# Patient Record
Sex: Male | Born: 1965 | Race: Black or African American | Hispanic: No | Marital: Single | Smoking: Never smoker
Health system: Southern US, Community
[De-identification: ages and names within clinical notes are randomized; demographics above are authoritative.]

## PROBLEM LIST (undated history)

## (undated) DIAGNOSIS — I10 Essential (primary) hypertension: Secondary | ICD-10-CM

## (undated) DIAGNOSIS — E119 Type 2 diabetes mellitus without complications: Secondary | ICD-10-CM

---

## 2012-06-10 ENCOUNTER — Emergency Department (HOSPITAL_COMMUNITY)
Admission: EM | Admit: 2012-06-10 | Discharge: 2012-06-10 | Disposition: A | Discharge status: Home / Self Care | Payer: No Typology Code available for payment source | Source: Home / Self Care

## 2012-06-10 ENCOUNTER — Encounter (HOSPITAL_COMMUNITY): Payer: Self-pay | Admitting: Emergency Medicine

## 2012-06-10 DIAGNOSIS — H9190 Unspecified hearing loss, unspecified ear: Secondary | ICD-10-CM

## 2012-06-10 DIAGNOSIS — H9319 Tinnitus, unspecified ear: Secondary | ICD-10-CM

## 2012-06-10 DIAGNOSIS — H9311 Tinnitus, right ear: Secondary | ICD-10-CM

## 2012-06-10 DIAGNOSIS — H9191 Unspecified hearing loss, right ear: Secondary | ICD-10-CM

## 2012-06-10 DIAGNOSIS — R51 Headache: Secondary | ICD-10-CM

## 2012-06-10 NOTE — ED Notes (Signed)
Pt reports symptoms started off a year ago went to have ears cleaned because of feeling fullness and trouble hearing. Ears were cleaned but started to have pain and did not go back to that provider.   States that in November when he was putting away bunk on truck it came down hitting the top of head.   Has been having constant headaches since and ringing, fullness, and trouble hearing.   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

## 2012-06-10 NOTE — ED Provider Notes (Signed)
Gabriel Willis is a 47 y.o. male who presents to Urgent Care today for several issues. 1) primary issue is urinary loss and tinnitus in the right ear.  This is been present for about one year. He has had a history of cerumen impaction. As his symptoms have worsened recently. He has not had any cerumen removal recently. He cannot think of any cause for this. He does not coexisting headache noted below.   2) patient notes headache starting about 6 months ago. He had a large object fall on the top of his head. Since then he notes a morning headache. He denies any blurry vision dizziness fogginess loss of coordination. Henies any history of sleep apnea. He feels well otherwise no fevers or chills.   PMH reviewed. Hypertension  History  Substance Use Topics  . Smoking status: Never Smoker   . Smokeless tobacco: Not on file  . Alcohol Use: Yes   ROS as above Medications reviewed. No current facility-administered medications for this encounter.   No current outpatient prescriptions on file.    Exam:  BP 155/105  Pulse 91  Temp(Src) 98.5 F (36.9 C) (Oral)  SpO2 97% Gen: Well NAD HEENT: EOMI,  MMM, right ear completely occluded by cerumen. Left tympanic membrane appears to be retracted . PERRL Lungs: CTABL Nl WOB Heart: RRR no MRG Abd: NABS, NT, ND Exts: Non edematous BL  LE, warm and well perfused.  Neuro : Alert and oriented cranial nerves II through XII are intact sensation and strength are intact balance is normal coordination is intact bilaterally gait is normal    The right ear was irrigated the tympanic membrane is normal-appearing after urination. His symptoms improved following irrigation No results found for this or any previous visit (from the past 24 hour(s)). No results found.  Assessment and Plan: 47 y.o. male with    1) hearing loss and tinnitus. Possibly cerumen impaction however could be other. Refer to ENT for hearing evaluation  2) morning headache: I am  concerned for sleep apnea. Recommend followup with primary care Dr. for consideration sleep study.     Rodolph Bong, MD 06/10/12 1630

## 2012-06-10 NOTE — ED Provider Notes (Signed)
Medical screening examination/treatment/procedure(s) were performed by non-physician practitioner and as supervising physician I was immediately available for consultation/collaboration.  Romona Murdy   Sharai Overbay, MD 06/10/12 1840 

## 2012-06-19 NOTE — ED Notes (Signed)
Patient called, asking for referral to Dr office for evaluation of hearing loss and ringing in ears. Appointment set with Dr Pollyann Kennedy, for April 3 rd at 10 am ; advised to arrive early for paperwork. Detailed information provided for patient regarding his appointmant

## 2019-05-28 ENCOUNTER — Other Ambulatory Visit: Payer: Self-pay

## 2019-05-28 ENCOUNTER — Encounter (HOSPITAL_COMMUNITY): Payer: Self-pay | Admitting: Emergency Medicine

## 2019-05-28 ENCOUNTER — Emergency Department (HOSPITAL_COMMUNITY)
Admission: EM | Admit: 2019-05-28 | Discharge: 2019-05-28 | Disposition: A | Discharge status: Home / Self Care | Payer: BC Managed Care – PPO | Attending: Emergency Medicine | Admitting: Emergency Medicine

## 2019-05-28 ENCOUNTER — Emergency Department (HOSPITAL_COMMUNITY): Payer: BC Managed Care – PPO

## 2019-05-28 DIAGNOSIS — R079 Chest pain, unspecified: Secondary | ICD-10-CM

## 2019-05-28 DIAGNOSIS — E119 Type 2 diabetes mellitus without complications: Secondary | ICD-10-CM | POA: Diagnosis not present

## 2019-05-28 DIAGNOSIS — R0789 Other chest pain: Secondary | ICD-10-CM | POA: Diagnosis not present

## 2019-05-28 DIAGNOSIS — I10 Essential (primary) hypertension: Secondary | ICD-10-CM | POA: Diagnosis not present

## 2019-05-28 HISTORY — DX: Type 2 diabetes mellitus without complications: E11.9

## 2019-05-28 HISTORY — DX: Essential (primary) hypertension: I10

## 2019-05-28 LAB — CBC
HCT: 42.7 % (ref 39.0–52.0)
Hemoglobin: 14.2 g/dL (ref 13.0–17.0)
MCH: 32.1 pg (ref 26.0–34.0)
MCHC: 33.3 g/dL (ref 30.0–36.0)
MCV: 96.6 fL (ref 80.0–100.0)
Platelets: 305 10*3/uL (ref 150–400)
RBC: 4.42 MIL/uL (ref 4.22–5.81)
RDW: 13.1 % (ref 11.5–15.5)
WBC: 11.6 10*3/uL — ABNORMAL HIGH (ref 4.0–10.5)
nRBC: 0 % (ref 0.0–0.2)

## 2019-05-28 LAB — D-DIMER, QUANTITATIVE: D-Dimer, Quant: <0.27 ug/mL-FEU (ref 0.00–0.50)

## 2019-05-28 LAB — BASIC METABOLIC PANEL
Anion gap: 9 (ref 5–15)
BUN: 12 mg/dL (ref 6–20)
CO2: 28 mmol/L (ref 22–32)
Calcium: 9.3 mg/dL (ref 8.9–10.3)
Chloride: 105 mmol/L (ref 98–111)
Creatinine, Ser: 1.21 mg/dL (ref 0.61–1.24)
GFR calc Af Amer: >60 mL/min (ref >60)
GFR calc non Af Amer: >60 mL/min (ref >60)
Glucose, Bld: 121 mg/dL — ABNORMAL HIGH (ref 70–99)
Potassium: 3.7 mmol/L (ref 3.5–5.1)
Sodium: 142 mmol/L (ref 135–145)

## 2019-05-28 LAB — TROPONIN I (HIGH SENSITIVITY)
Troponin I (High Sensitivity): 7 ng/L (ref <18)
Troponin I (High Sensitivity): 7 ng/L (ref <18)

## 2019-05-28 MED ORDER — LOSARTAN POTASSIUM 50 MG PO TABS
100.0000 mg | ORAL_TABLET | Freq: Once | ORAL | Status: AC
  Administered 2019-05-28: 100 mg via ORAL
  Filled 2019-05-28: qty 2

## 2019-05-28 MED ORDER — HYDROCHLOROTHIAZIDE 25 MG PO TABS
25.0000 mg | ORAL_TABLET | Freq: Every day | ORAL | Status: DC
  Administered 2019-05-28: 21:00:00 25 mg via ORAL
  Filled 2019-05-28: qty 1

## 2019-05-28 MED ORDER — LOSARTAN POTASSIUM-HCTZ 100-25 MG PO TABS: 1.0000 | ORAL_TABLET | Freq: Every day | ORAL | 0 refills | Status: AC

## 2019-05-28 NOTE — ED Notes (Addendum)
Patient verbalizes understanding of discharge instructions. Opportunity for questioning and answers were provided. Armband removed by staff, pt discharged from ED ambulatory. Esignature pad not available in hallway

## 2019-05-28 NOTE — ED Provider Notes (Signed)
[facility] EMERGENCY DEPARTMENT Provider Note   CSN: [id number] Arrival date & time: 05/28/19  1534     History Chief Complaint  Patient presents with  . Chest Pain    Gabriel Willis is a 54 y.o. male.  Gabriel Willis is a 54 y.o. male with a history of hypertension and diabetes, notes to the ED for evaluation of intermittent chest pain episodes.  Patient states that over the past month he has intermittently noted pain in his chest.  It often happens when he is up walking around but occasionally occurs at rest.  He reports it lasts a few minutes and then goes away.  He describes it as a sort of pressure and tightness in the left side of his chest.  Denies any current chest pain. He reports that this week he has had some occasional shortness of breath with these episodes and a cough, was seen in urgent care and had a Covid test done.  He has not had any fevers and states he is very careful and does not know of any Covid exposures.  He does state that he ran out of his blood pressure medicine about 2 months ago and he wonders if that is contributing to these chest pain episodes.  He does work as a Administrator, has not had any lower extremity swelling or pain.  Reports a family history of heart disease, he has not ever seen a cardiologist or had stress testing.  No history of PE or DVT.  No smoking history.  No other aggravating or alleviating factors.        Past Medical History:  Diagnosis Date  . Diabetes mellitus without complication ([facility])   . Hypertension     There are no problems to display for this patient.   History reviewed. No pertinent surgical history.     No family history on file.  Social History   Tobacco Use  . Smoking status: Never Smoker  Substance Use Topics  . Alcohol use: Yes  . Drug use: No    Home Medications Prior to Admission medications   Not on File    Allergies    Patient has no known allergies.  Review of Systems     Review of Systems  Constitutional: Negative for chills and fever.  HENT: Negative.   Respiratory: Positive for cough and shortness of breath.   Cardiovascular: Positive for chest pain.  Gastrointestinal: Negative for abdominal pain, nausea and vomiting.  Genitourinary: Negative for dysuria and frequency.  Musculoskeletal: Negative for arthralgias and myalgias.  Skin: Negative for color change and rash.  Neurological: Negative for dizziness, syncope and light-headedness.    Physical Exam Updated Vital Signs BP (!) 169/114   Pulse 63   Temp 98.7 F (37.1 C) (Oral)   Resp 18   Ht 6\' 2"  (1.88 m)   Wt 127.9 kg   SpO2 98%   BMI 36.21 kg/m   Physical Exam Vitals and nursing note reviewed.  Constitutional:      General: He is not in acute distress.    Appearance: He is well-developed. He is not diaphoretic.  HENT:     Head: Normocephalic and atraumatic.  Eyes:     General:        Right eye: No discharge.        Left eye: No discharge.     Pupils: Pupils are equal, round, and reactive to light.  Cardiovascular:     Rate and Rhythm: Normal rate  and regular rhythm.     Pulses:          Radial pulses are 2+ on the right side and 2+ on the left side.       Dorsalis pedis pulses are 2+ on the right side and 2+ on the left side.     Heart sounds: Normal heart sounds. No murmur. No friction rub. No gallop.   Pulmonary:     Effort: Pulmonary effort is normal. No respiratory distress.     Breath sounds: Normal breath sounds. No wheezing or rales.     Comments: Respirations equal and unlabored, patient able to speak in full sentences, lungs clear to auscultation bilaterally Chest:     Chest wall: No tenderness.  Abdominal:     General: Bowel sounds are normal. There is no distension.     Palpations: Abdomen is soft. There is no mass.     Tenderness: There is no abdominal tenderness. There is no guarding.     Comments: Abdomen soft, nondistended, nontender to palpation in all  quadrants without guarding or peritoneal signs  Musculoskeletal:        General: No deformity.     Cervical back: Neck supple.     Right lower leg: No edema.     Left lower leg: No tenderness. No edema.     Comments: Bilateral lower extremities are warm and well-perfused without edema or tenderness  Skin:    General: Skin is warm and dry.     Capillary Refill: Capillary refill takes less than 2 seconds.  Neurological:     Mental Status: He is alert.     Coordination: Coordination normal.     Comments: Speech is clear, able to follow commands Moves extremities without ataxia, coordination intact  Psychiatric:        Mood and Affect: Mood normal.        Behavior: Behavior normal.     ED Results / Procedures / Treatments   Labs (all labs ordered are listed, but only abnormal results are displayed) Labs Reviewed  BASIC METABOLIC PANEL - Abnormal; Notable for the following components:      Result Value   Glucose, Bld 121 (*)    All other components within normal limits  CBC - Abnormal; Notable for the following components:   WBC 11.6 (*)    All other components within normal limits  TROPONIN I (HIGH SENSITIVITY)  TROPONIN I (HIGH SENSITIVITY)    EKG EKG Interpretation  Date/Time:  Tuesday May 28 2019 15:39:00 EST Ventricular Rate:  89 PR Interval:  152 QRS Duration: 90 QT Interval:  362 QTC Calculation: 440 R Axis:   71 Text Interpretation: Normal sinus rhythm Normal ECG no prior available for comparison Confirmed by Tilden Fossa [phone]) on 05/28/2019 6:41:28 PM   Radiology DG Chest 2 View  Result Date: 05/28/2019 CLINICAL DATA:  Chest pain and dry cough EXAM: CHEST - 2 VIEW COMPARISON:  None. FINDINGS: The heart size and mediastinal contours are within normal limits. Both lungs are clear. The visualized skeletal structures are unremarkable. IMPRESSION: No acute abnormality of the lungs. Electronically Signed   By: Lauralyn Primes M.D.   On: 05/28/2019 16:24     Procedures Procedures (including critical care time)  Medications Ordered in ED Medications  losartan (COZAAR) tablet 100 mg (100 mg Oral Given 05/28/19 2056)    ED Course  I have reviewed the triage vital signs and the nursing notes.  Pertinent labs & imaging results that were  available during my care of the patient were reviewed by me and considered in my medical decision making (see chart for details).    MDM Rules/Calculators/A&P                     Patient presents to the emergency department with chest pain. Patient nontoxic appearing, in no apparent distress, vitals without significant abnormality. Fairly benign physical exam.   Prior heart catheterization reviewed: none Patient's primary cardiologist: none  DDX: ACS, pulmonary embolism, dissection, pneumothorax, pneumonia, arrhythmia, severe anemia, MSK, GERD, anxiety. Evaluation initiated with labs, EKG, and CXR. Patient on cardiac monitor.   CBC: Mild leukocytosis, normal hemoglobin BMP: Glucose of 121, no other electrolyte derangements, normal renal function Troponin: Negative x2 D-dimer: Negative EKG: Normal sinus rhythm with no ischemic changes, no prior available for comparison CXR:  Negative, without infiltrate, effusion, pneumothorax, or fracture/dislocation.   [place] Pathway Score 3- EKG without obvious acute ischemia, delta troponin negative, doubt ACS. D-dimer negative, doubt pulmonary embolism. Pain is not a tearing sensation, symmetric pulses, no widening of mediastinum on CXR, doubt dissection. Cardiac monitor reviewed, no notable arrhythmias or tachycardia. Patient has appeared hemodynamically stable throughout ER visit and appears safe for discharge with close PCP/cardiology follow up. I discussed results, treatment plan, need for PCP follow-up, and return precautions with the patient. Provided opportunity for questions, patient confirmed understanding and is in agreement with plan.   Final Clinical  Impression(s) / ED Diagnoses Final diagnoses:  Central chest pain  Hypertension, unspecified type    Rx / DC Orders ED Discharge Orders    None       Legrand Rams 05/31/19 1334    Tilden Fossa, MD 05/31/19 1447

## 2019-05-28 NOTE — Discharge Instructions (Signed)
Your work-up today was reassuring, but it is important that you follow-up with cardiology regarding the episodes of chest pain you have been having.  I placed a referral but if you do not hear from them in the next few days please call to schedule an appointment.  I have refilled your blood pressure medication please begin taking this again daily.  Return to the ED for any new or concerning symptoms.

## 2019-05-28 NOTE — ED Triage Notes (Signed)
Pt endorses intermittent chest pressure for a few days. Not on BP meds for a month due to changing PCPs.

## 2019-05-30 ENCOUNTER — Ambulatory Visit: Payer: BC Managed Care – PPO | Admitting: Cardiology

## 2019-05-30 ENCOUNTER — Encounter: Payer: Self-pay | Admitting: Cardiology

## 2019-05-30 ENCOUNTER — Other Ambulatory Visit: Payer: Self-pay

## 2019-05-30 ENCOUNTER — Encounter: Payer: Self-pay | Admitting: *Deleted

## 2019-05-30 ENCOUNTER — Telehealth: Payer: Self-pay | Admitting: Cardiology

## 2019-05-30 VITALS — BP 142/98 | HR 96 | Ht 74.0 in | Wt 292.8 lb

## 2019-05-30 DIAGNOSIS — Z8249 Family history of ischemic heart disease and other diseases of the circulatory system: Secondary | ICD-10-CM

## 2019-05-30 DIAGNOSIS — R072 Precordial pain: Secondary | ICD-10-CM | POA: Diagnosis not present

## 2019-05-30 DIAGNOSIS — I10 Essential (primary) hypertension: Secondary | ICD-10-CM

## 2019-05-30 DIAGNOSIS — E119 Type 2 diabetes mellitus without complications: Secondary | ICD-10-CM

## 2019-05-30 DIAGNOSIS — Z7189 Other specified counseling: Secondary | ICD-10-CM

## 2019-05-30 DIAGNOSIS — E785 Hyperlipidemia, unspecified: Secondary | ICD-10-CM

## 2019-05-30 NOTE — Progress Notes (Signed)
Cardiology Office Note:    Date:  05/30/2019   ID:  Gabriel Willis, DOB 25-Sep-1965, MRN [id number]  PCP:  Patient, No Pcp Per pending switch to new PCP Cardiologist:  Gabriel Red, MD  Referring MD: Gabriel Willis   CC: new patient consultation for chest pain  History of Present Illness:    Gabriel Willis is a 54 y.o. male with a hx of hypertension, type II diabetes who is seen as a new consult at the request of Gabriel Lodge, PA-C for the evaluation and management of chest pain.  ER note from 05/28/19 reviewed. Incomplete note at this time. hsTn 7, 7. Ddimer normal. Referred to cardiology for further evaluation.  Chest pain: -Initial onset, description, frequency, duration: about 5 days ago, lasts 1-2 minutes (10 minutes at the longest), not related to exertion (happening lying down, driving, etc). Happening about 3 times/day. Feels like a chest pressure in the center of his chest. No radiation. Noted when driving in the [place], was winter weather. Has had issues with breathing/tightness in his chest with cold weather. 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 Ran out of losartan in January waiting for new PCP visit, just refilled yesterday. Has not taken yet today. 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 Older brother had 4V CABG. Sister with a heart murmur, another sister with a pacemaker. Mother had high blood pressure, diabetes. Died age 53 of  kidney failure. Father died of lung cancer at age 51. Total of 10 siblings, 5 boys/5 girls.   Notes history of high cholesterol, though I do not have numbers. Has gained weight with the pandemic. Working on diet, eating more steamed vegetables. Minimal activity as a truck driver.  Past Medical History:  Diagnosis Date  . Diabetes mellitus without complication (HCC)   . Hypertension     History reviewed. No pertinent surgical history.  Current Medications: Current Outpatient Medications on File Prior to Visit  Medication Sig  . losartan-hydrochlorothiazide (HYZAAR) 100-25 MG tablet Take 1 tablet by mouth daily.  . metFORMIN (GLUCOPHAGE) 500 MG tablet Take 500 mg by mouth daily with breakfast.   No current facility-administered medications on file prior to visit.     Allergies:   Patient has no known allergies.   Social History   Tobacco Use  . Smoking status: Never Smoker  . Smokeless tobacco: Never Used  Substance Use Topics  . Alcohol use: Yes  . Drug use: No    Family History: younger brother has high blood pressure and diabetes, had a heart cath, has sickle cell trait. Older brother had 4V CABG. Sister with a heart murmur, another sister with a pacemaker. Mother had high blood pressure, diabetes. Died age 64 of kidney failure. Father died of lung cancer at age 54. Total of 10 siblings, 5 boys/5 girls.   ROS:   Please see the history of present illness.  Additional pertinent ROS: Constitutional: Negative for chills, fever, night sweats, unintentional weight loss  HENT: Negative for ear pain and hearing loss.   Eyes: Negative for  loss of vision and eye pain.  Respiratory: Negative for cough, sputum, wheezing.   Cardiovascular: See HPI. Gastrointestinal: Negative for abdominal pain, melena, and hematochezia.  Genitourinary: Negative for dysuria and hematuria.  Musculoskeletal: Negative for falls and myalgias.  Skin: Negative for itching and rash.  Neurological: Negative  for focal weakness, focal sensory changes and loss of consciousness.  Endo/Heme/Allergies: Does not bruise/bleed easily.     EKGs/Labs/Other Studies Reviewed:    The following studies were reviewed today: No prior cardiac studies  EKG:  EKG is personally reviewed.  The ekg ordered today demonstrates NSR, nonspecific changes  Recent Labs: 05/28/2019: BUN 12; Creatinine, Ser 1.21; Hemoglobin 14.2; Platelets 305; Potassium 3.7; Sodium 142  Recent Lipid Panel No results found for: CHOL, TRIG, HDL, CHOLHDL, VLDL, LDLCALC, LDLDIRECT  Physical Exam:    VS:  BP (!) 142/98   Pulse 96   Ht 6\' 2"  (1.88 m)   Wt 292 lb 12.8 oz (132.8 kg)   SpO2 96%   BMI 37.59 kg/m     Wt Readings from Last 3 Encounters:  05/30/19 292 lb 12.8 oz (132.8 kg)  05/28/19 282 lb (127.9 kg)    GEN: Well nourished, well developed in no acute distress HEENT: Normal, moist mucous membranes NECK: No JVD CARDIAC: regular rhythm, normal S1 and S2, no rubs or gallops. No murmurs. VASCULAR: Radial and DP pulses 2+ bilaterally. No carotid bruits RESPIRATORY:  Clear to auscultation without rales, wheezing or rhonchi  ABDOMEN: Soft, non-tender, non-distended MUSCULOSKELETAL:  Ambulates independently SKIN: Warm and dry, no edema NEUROLOGIC:  Alert and oriented x 3. No focal neuro deficits noted. PSYCHIATRIC:  Normal affect    ASSESSMENT:    1. Precordial pain   2. Type 2 diabetes mellitus without complication, without long-term current use of insulin (HCC)   3. Essential hypertension   4. Family history of heart disease   5. Cardiac risk counseling   6. Counseling on health promotion and disease prevention   7. Hyperlipidemia, unspecified hyperlipidemia type    PLAN:    Chest pain: has significant risk factors. Given diabetes, needs imaging study -discussed treadmill stress, nuclear stress/lexiscan, and CT coronary angiography. Discussed pros and cons of each, including but not limited to false positive/false  negative risk, radiation risk, and risk of IV contrast dye. Based on shared decision making, decision was made to pursue nuclear stress test -counseled on Willis flag warning signs that need immediate medical attention  Type II diabetes: -A1c today 6.9 -on metformin  Hypertension: BP goal <130/80, above goal today -continue losartan-HCTZ -instructed on how to monitor BP  Family history of heart disease: discussed importance of risk factor modification and prevention  Hyperlipidemia: reported, but no available labs -lipids today show Tchol 237, TG 151, LDL 157 -given diabetes, started rosuvastatin 20 mg daily -follow up lipids, LFTs  Cardiac risk counseling and prevention recommendations: -recommend heart healthy/Mediterranean diet, with whole grains, fruits, vegetable, fish, lean meats, nuts, and olive oil. Limit salt. -recommend moderate walking, 3-5 times/week for 30-50 minutes each session. Aim for at least 150 minutes.week. Goal should be pace of 3 miles/hours, or walking 1.5 miles in 30 minutes -recommend avoidance of tobacco products. Avoid excess alcohol. -Additional risk factor control:  -Weight: BMI 37 -ASCVD risk score: The 10-year ASCVD risk score 07/28/19 DC Jr., et al., 2013) is: 21.6%   Values used to calculate the score:     Age: 13 years     Sex: Male     Is Non-Hispanic  African American: Yes     Diabetic: Yes     Tobacco smoker: No     Systolic Blood Pressure: 725 mmHg     Is BP treated: Yes     HDL Cholesterol: 53 mg/dL     Total Cholesterol: 237 mg/dL    Plan for follow up: 2 years, or sooner if testing abnormal-->will change to annual follow up  Buford Dresser, MD, PhD [place]  [facility]    Medication Adjustments/Labs and Tests Ordered: Current medicines are reviewed at length with the patient today.  Concerns regarding medicines are outlined above.  Orders Placed This Encounter  Procedures  . HgB A1c  . Lipid Panel With LDL/HDL Ratio  .  MYOCARDIAL PERFUSION IMAGING  . EKG 12-Lead   No orders of the defined types were placed in this encounter.   Patient Instructions  Medication Instructions:  Your Physician recommend you continue on your current medication as directed.    *If you need a refill on your cardiac medications before your next appointment, please call your pharmacy*   Lab Work: Your physician recommends that you return for lab work today (Lipid, A1C)  If you have labs (blood work) drawn today and your tests are completely normal, you will receive your results only by: Marland Kitchen MyChart Message (if you have MyChart) OR . A paper copy in the mail If you have any lab test that is abnormal or we need to change your treatment, we will call you to review the results.   Testing/Procedures: Your physician has requested that you have en exercise stress myoview. For further information please visit [email or web address]. Please follow instruction sheet, as given. [place]. [address]  You will need to have the coronavirus test completed prior to your procedure. This is a Drive Up Visit at the [facility] [address]. Someone will direct you to the appropriate testing line. Please tell them that you are there for procedure testing. Stay in your car and someone will be with you shortly. Please make sure to have all other labs completed before this test because you will need to stay quarantined until your procedure.    Follow-Up: At [facility], you and your health needs are our priority.  As part of our continuing mission to provide you with exceptional heart care, we have created designated Provider Care Teams.  These Care Teams include your primary Cardiologist (physician) and Advanced Practice Providers (APPs -  Physician Assistants and Nurse Practitioners) who all work together to provide you with the care you need, when you need it.  We recommend signing up for the patient portal called  "MyChart".  Sign up information is provided on this After Visit Summary.  MyChart is used to connect with patients for Virtual Visits (Telemedicine).  Patients are able to view lab/test results, encounter notes, upcoming appointments, etc.  Non-urgent messages can be sent to your provider as well.   To learn more about what you can do with MyChart, go to [email or web address].    Your next appointment:   2 year(s)  The format for your next appointment:   In Person  Provider:   Buford Dresser, MD  You are scheduled for a Myocardial Perfusion Imaging Study on.  Please arrive 15 minutes prior to your appointment time for registration and insurance purposes.  The test will take approximately 3 to 4 hours to complete; you may bring reading material.  If someone comes with you to your appointment, they  will need to remain in the main lobby due to limited space in the testing area. **If you are pregnant or breastfeeding, please notify the nuclear lab prior to your appointment**  How to prepare for your Myocardial Perfusion Test: . Do not eat or drink 3 hours prior to your test, except you may have water. . Do not consume products containing caffeine (regular or decaffeinated) 12 hours prior to your test. (ex: coffee, chocolate, sodas, tea). . Do bring a list of your current medications with you.  If not listed below, you may take your medications as normal. . Do wear comfortable clothes (no dresses or overalls) and walking shoes, tennis shoes preferred (No heels or open toe shoes are allowed). . Do NOT wear cologne, perfume, aftershave, or lotions (deodorant is allowed). . If these instructions are not followed, your test will have to be rescheduled.  Please report to [address] [facility], [address] for your test.  If you have questions or concerns about your appointment, you can call the Nuclear Lab at [phone].  If you cannot keep your appointment, please provide 24 hours  notification to the Nuclear Lab, to avoid a possible $50 charge to your account.    Signed, Gabriel Red, MD PhD 05/30/2019 5:18 PM    [facility]

## 2019-05-30 NOTE — Patient Instructions (Addendum)
Medication Instructions:  Your Physician recommend you continue on your current medication as directed.    *If you need a refill on your cardiac medications before your next appointment, please call your pharmacy*   Lab Work: Your physician recommends that you return for lab work today (Lipid, A1C)  If you have labs (blood work) drawn today and your tests are completely normal, you will receive your results only by: Marland Kitchen MyChart Message (if you have MyChart) OR . A paper copy in the mail If you have any lab test that is abnormal or we need to change your treatment, we will call you to review the results.   Testing/Procedures: Your physician has requested that you have en exercise stress myoview. For further information please visit [email or web address]. Please follow instruction sheet, as given. [place]  You will need to have the coronavirus test completed prior to your procedure. This is a Drive Up Visit at the [facility] [address] Someone will direct you to the appropriate testing line. Please tell them that you are there for procedure testing. Stay in your car and someone will be with you shortly. Please make sure to have all other labs completed before this test because you will need to stay quarantined until your procedure.    Follow-Up: At [facility], you and your health needs are our priority.  As part of our continuing mission to provide you with exceptional heart care, we have created designated Provider Care Teams.  These Care Teams include your primary Cardiologist (physician) and Advanced Practice Providers (APPs -  Physician Assistants and Nurse Practitioners) who all work together to provide you with the care you need, when you need it.  We recommend signing up for the patient portal called "MyChart".  Sign up information is provided on this After Visit Summary.  MyChart is used to connect with patients for Virtual Visits  (Telemedicine).  Patients are able to view lab/test results, encounter notes, upcoming appointments, etc.  Non-urgent messages can be sent to your provider as well.   To learn more about what you can do with MyChart, go to [email or web address].    Your next appointment:   2 year(s)  The format for your next appointment:   In Person  Provider:   Buford Dresser, MD  You are scheduled for a Myocardial Perfusion Imaging Study on.  Please arrive 15 minutes prior to your appointment time for registration and insurance purposes.  The test will take approximately 3 to 4 hours to complete; you may bring reading material.  If someone comes with you to your appointment, they will need to remain in the main lobby due to limited space in the testing area. **If you are pregnant or breastfeeding, please notify the nuclear lab prior to your appointment**  How to prepare for your Myocardial Perfusion Test: . Do not eat or drink 3 hours prior to your test, except you may have water. . Do not consume products containing caffeine (regular or decaffeinated) 12 hours prior to your test. (ex: coffee, chocolate, sodas, tea). . Do bring a list of your current medications with you.  If not listed below, you may take your medications as normal. . Do wear comfortable clothes (no dresses or overalls) and walking shoes, tennis shoes preferred (No heels or open toe shoes are allowed). . Do NOT wear cologne, perfume, aftershave, or lotions (deodorant is allowed). . If these instructions are not followed, your test will have to be  rescheduled.  Please report to [address] [facility], [address] for your test.  If you have questions or concerns about your appointment, you can call the Nuclear Lab at [phone].  If you cannot keep your appointment, please provide 24 hours notification to the Nuclear Lab, to avoid a possible $50 charge to your account.

## 2019-05-30 NOTE — Telephone Encounter (Signed)
Patient states he is requesting a letter from Dr. Cristal Deer stating that he is eligible to return to work. Please assist.

## 2019-05-30 NOTE — Telephone Encounter (Signed)
Pt is calling in to request for Dr. Cristal Deer to write him a short supporting letter stating that he can return to work from a cardiac perspective.  Pt states he is currently activating his mychart account, so once the letter is complete, Dr. Cristal Deer or her RN can send the letter via his mychart account.  Informed the pt that I will route this message to both Dr. Cristal Deer and her RN for further review, recommendation, and follow-up with the pt thereafter.  Pt verbalized understanding and agrees with this plan.

## 2019-05-31 LAB — HEMOGLOBIN A1C
Est. average glucose Bld gHb Est-mCnc: 151 mg/dL
Hgb A1c MFr Bld: 6.9 % — ABNORMAL HIGH (ref 4.8–5.6)

## 2019-05-31 LAB — LIPID PANEL WITH LDL/HDL RATIO
Cholesterol, Total: 237 mg/dL — ABNORMAL HIGH (ref 100–199)
HDL: 53 mg/dL (ref >39)
LDL Chol Calc (NIH): 157 mg/dL — ABNORMAL HIGH (ref 0–99)
LDL/HDL Ratio: 3 ratio (ref 0.0–3.6)
Triglycerides: 151 mg/dL — ABNORMAL HIGH (ref 0–149)
VLDL Cholesterol Cal: 27 mg/dL (ref 5–40)

## 2019-05-31 NOTE — Telephone Encounter (Signed)
Follow Up:    Pt is checking on the status of his return to work letter please.

## 2019-06-03 NOTE — Telephone Encounter (Signed)
Called the patient Gabriel Willis to confirm the fax number and who I needed to make the fax to the the attention of. Patient stated that I was faxing to him and he will give the letter to his employer. I faxed the work letter to the patient at [phone].

## 2019-06-03 NOTE — Telephone Encounter (Signed)
Gabriel Willis, if you could draft a letter that says he is ok to return to work as long as he does not have recurrent chest pain. If he does, then he should contact us and we will need to move up his nuclear stress test. He will have full clearance after the stress test. Thanks.

## 2019-06-03 NOTE — Telephone Encounter (Signed)
Patient calling to request his letter be Faxed to: [phone].

## 2019-06-03 NOTE — Telephone Encounter (Signed)
Letter generated and placed up front for pick up.  

## 2019-06-04 ENCOUNTER — Other Ambulatory Visit: Payer: Self-pay

## 2019-06-04 MED ORDER — ROSUVASTATIN CALCIUM 20 MG PO TABS: 20.0000 mg | ORAL_TABLET | Freq: Every day | ORAL | 11 refills | Status: AC

## 2019-06-18 ENCOUNTER — Other Ambulatory Visit (HOSPITAL_COMMUNITY): Payer: BC Managed Care – PPO

## 2019-06-21 ENCOUNTER — Ambulatory Visit (HOSPITAL_COMMUNITY)
Admission: RE | Admit: 2019-06-21 | Payer: BC Managed Care – PPO | Source: Ambulatory Visit | Attending: Cardiology | Admitting: Cardiology

## 2019-06-25 ENCOUNTER — Inpatient Hospital Stay (HOSPITAL_COMMUNITY): Admission: RE | Admit: 2019-06-25 | Payer: BC Managed Care – PPO | Source: Ambulatory Visit

## 2019-07-01 ENCOUNTER — Encounter (HOSPITAL_COMMUNITY): Payer: Self-pay | Admitting: Cardiology

## 2019-07-11 ENCOUNTER — Telehealth (HOSPITAL_COMMUNITY): Payer: Self-pay | Admitting: Cardiology

## 2019-07-11 NOTE — Telephone Encounter (Signed)
Thank you :)

## 2019-07-11 NOTE — Telephone Encounter (Signed)
Just an FYI. We have made several attempts to contact this patient including sending a letter to schedule or reschedule their Myocardial Perfusion Test . We will be removing the patient from the Nuclear  WQ.   Mailed letter  07/01/19 [facility] to schedule @ 1:54/LBW  06/27/19 [facility] to reschedule @ 11:01/LBW  06/25/19 [facility] to reschedule @ 8:47/LBW      Thank you

## 2019-07-22 ENCOUNTER — Telehealth: Payer: Self-pay

## 2019-07-22 DIAGNOSIS — Z79899 Other long term (current) drug therapy: Secondary | ICD-10-CM

## 2019-07-22 NOTE — Telephone Encounter (Signed)
Attempted to contact pt.  Left message to call back.  

## 2019-07-22 NOTE — Telephone Encounter (Signed)
-----   Message from Jodelle Red, MD sent at 07/18/2019  5:23 PM EDT ----- Regarding: follow up labs I was checking my list and saw I started him on rosuvastatin without follow up labs. Can you call and let him know he should come sometime in June to have his lipids and LFTs checked? And i'd like to see him back in 1 year instead of 2 given his lipids. Thanks!

## 2019-07-25 NOTE — Telephone Encounter (Signed)
Attempted to contact pt x 2. Left message to call back 

## 2019-07-30 NOTE — Telephone Encounter (Addendum)
Attempted to contact pt x 3. Left message that lab orders will be mail.

## 2020-06-12 IMAGING — CR DG CHEST 2V
2 series · 2 of 2 positions shown · non-contrast
Comparison: None.

CLINICAL DATA: Chest pain and dry cough

EXAM:
CHEST - 2 VIEW

[chest pa]
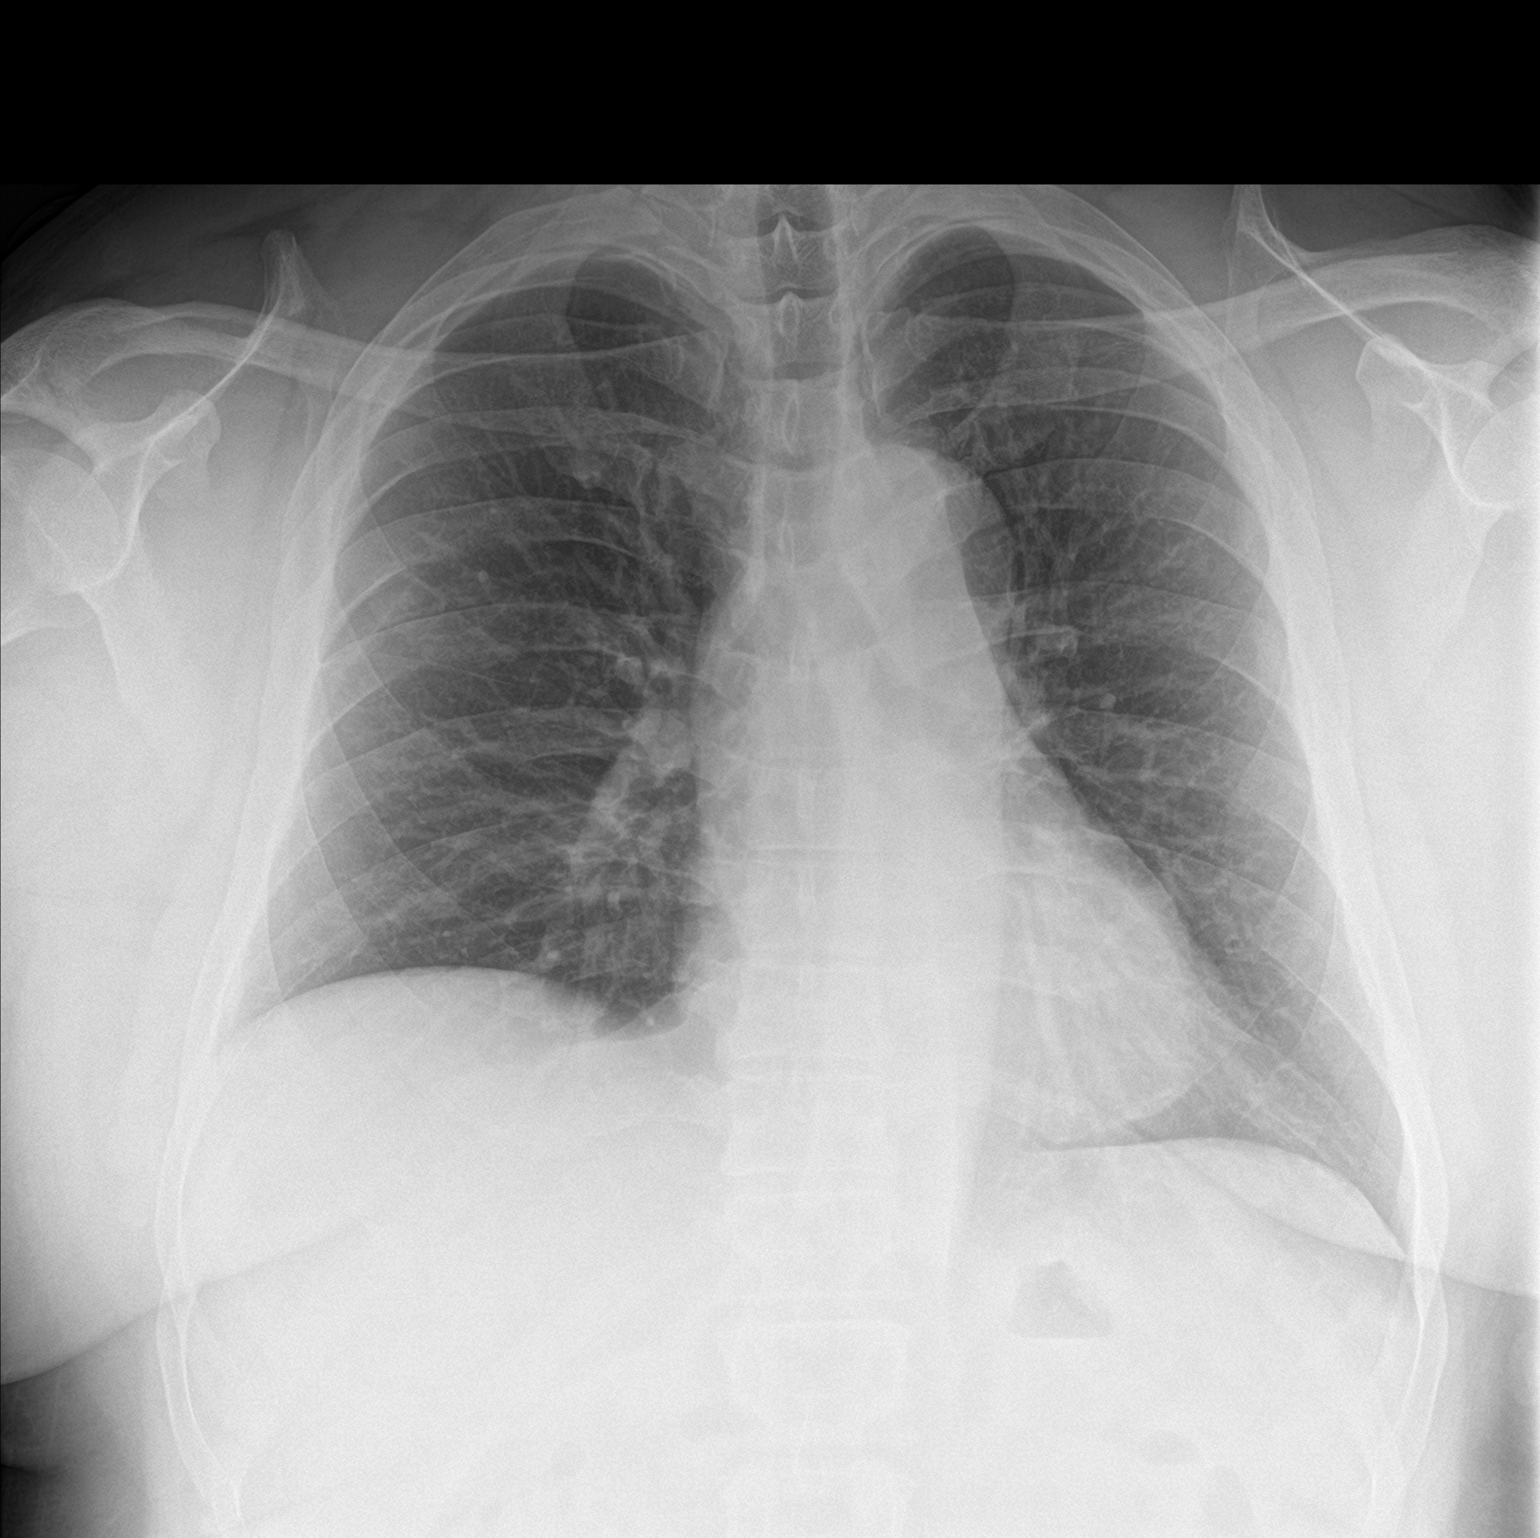

[chest lat]
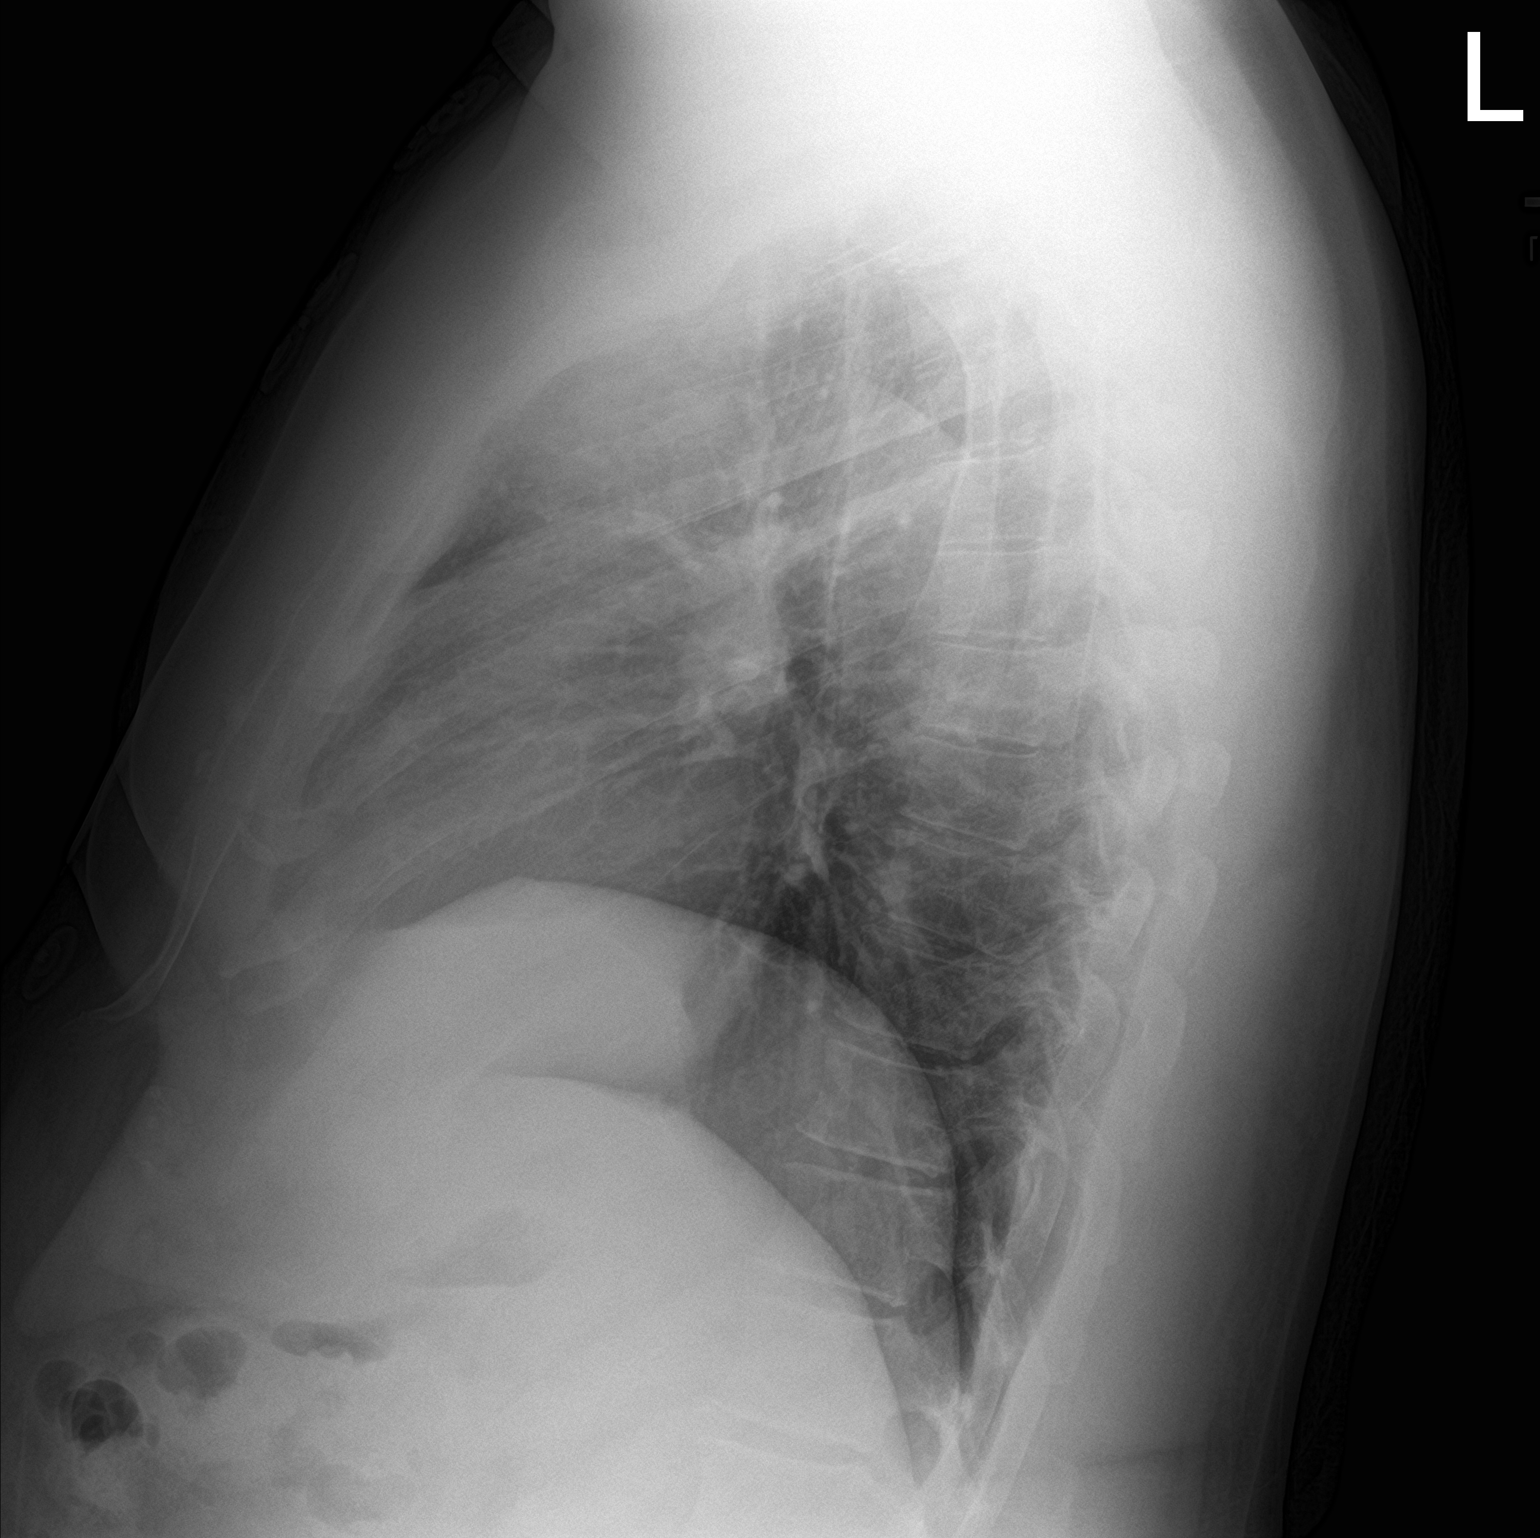

[2 of 2 positions shown; findings below may reference images not displayed]

FINDINGS: The heart size and mediastinal contours are within normal limits.
Both lungs are clear. The visualized skeletal structures are
unremarkable.
IMPRESSION: No acute abnormality of the lungs.

## 2022-03-23 ENCOUNTER — Encounter: Payer: Self-pay | Admitting: Cardiology
